# Patient Record
Sex: Male | Born: 1965 | Hispanic: No | Marital: Married | Smoking: Former smoker
Health system: Southern US, Community
[De-identification: ages and names within clinical notes are randomized; demographics above are authoritative.]

## PROBLEM LIST (undated history)

## (undated) DIAGNOSIS — G4730 Sleep apnea, unspecified: Secondary | ICD-10-CM

## (undated) DIAGNOSIS — I10 Essential (primary) hypertension: Secondary | ICD-10-CM

## (undated) DIAGNOSIS — E119 Type 2 diabetes mellitus without complications: Secondary | ICD-10-CM

## (undated) DIAGNOSIS — E7800 Pure hypercholesterolemia, unspecified: Secondary | ICD-10-CM

## (undated) DIAGNOSIS — Z87442 Personal history of urinary calculi: Secondary | ICD-10-CM

## (undated) HISTORY — PX: LITHOTRIPSY: SUR834

---

## 2014-10-02 ENCOUNTER — Other Ambulatory Visit: Payer: Self-pay | Admitting: Internal Medicine

## 2014-10-02 DIAGNOSIS — R3129 Other microscopic hematuria: Secondary | ICD-10-CM

## 2014-10-04 ENCOUNTER — Ambulatory Visit
Admission: RE | Admit: 2014-10-04 | Discharge: 2014-10-04 | Disposition: A | Discharge status: Home / Self Care | Payer: No Typology Code available for payment source | Source: Ambulatory Visit | Attending: Internal Medicine | Admitting: Internal Medicine

## 2014-10-04 DIAGNOSIS — R3129 Other microscopic hematuria: Secondary | ICD-10-CM

## 2015-10-03 IMAGING — CT CT ABD-PELV W/O CM
3 of 4 series · 8 of 46 positions shown, 15 images · non-contrast
Comparison: None.

CLINICAL DATA: Microscopic hematuria, denies pain. Prior
lithotripsy.

EXAM:
CT ABDOMEN AND PELVIS WITHOUT CONTRAST
TECHNIQUE: Multidetector CT imaging of the abdomen and pelvis was performed
following the standard protocol without IV contrast.

[Series 3: lung windows · axial · 0.70mm/px · z∈[-77,-17]mm · 4 of 20 slices shown, 9 images]
[im 4/20  soft-tissue]
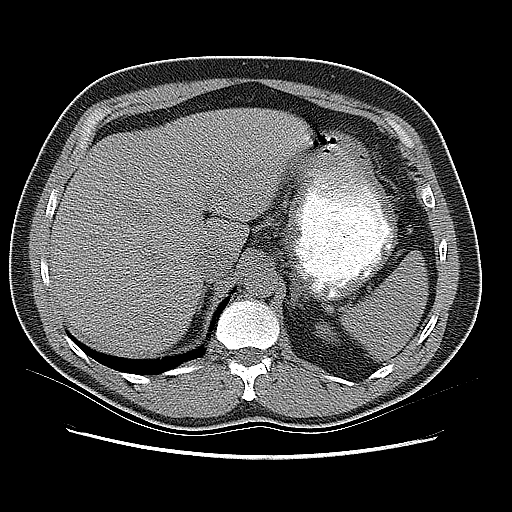
[im 4/20  lung]
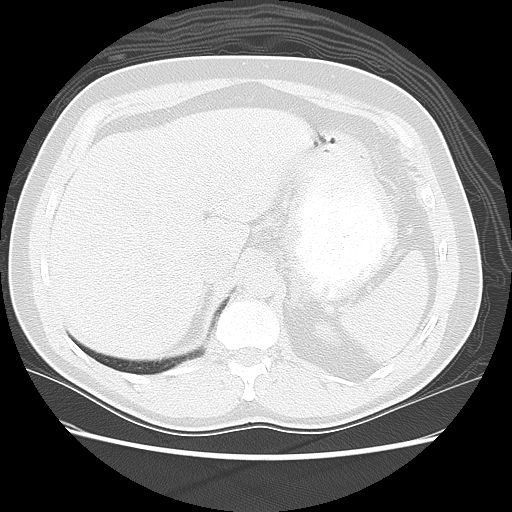
[im 4/20  bone]
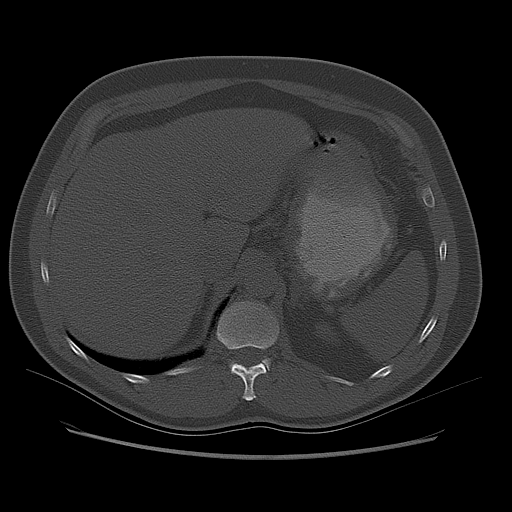
[im 8/20  soft-tissue]
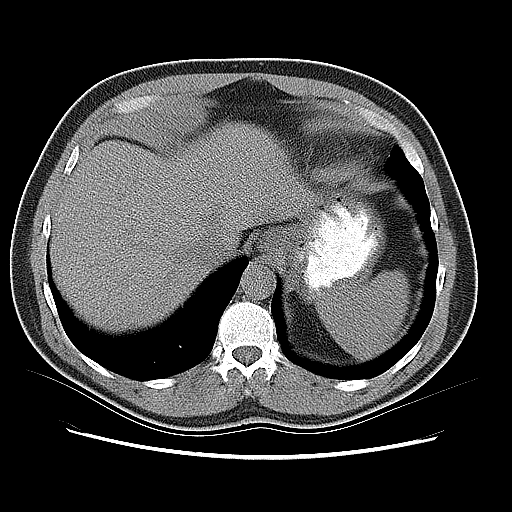
[im 8/20  lung]
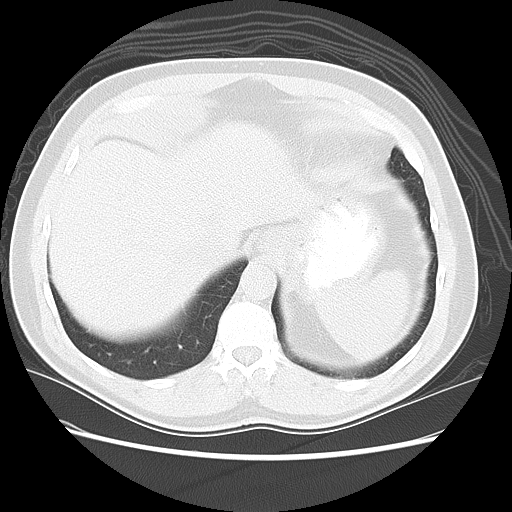
[im 12/20  soft-tissue]
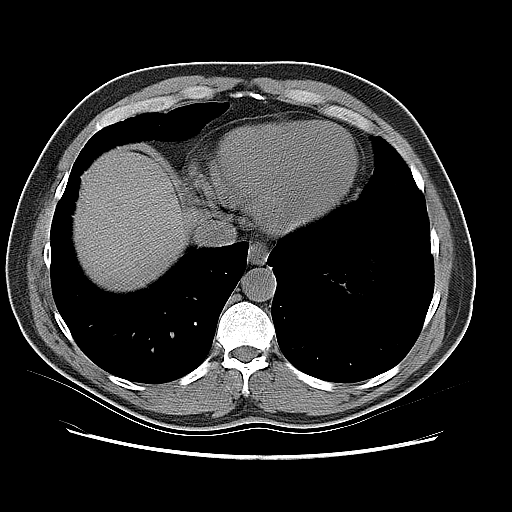
[im 12/20  lung]
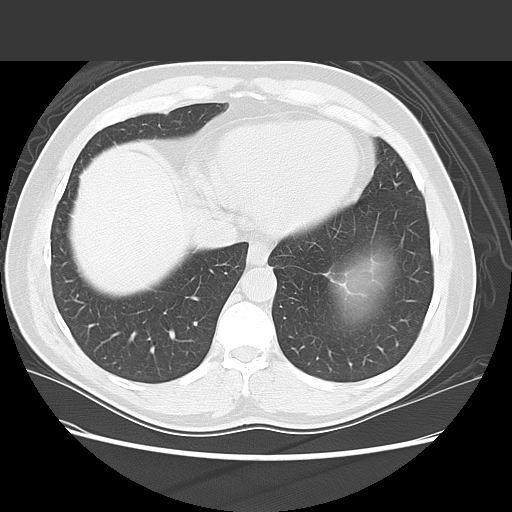
[im 16/20  soft-tissue]
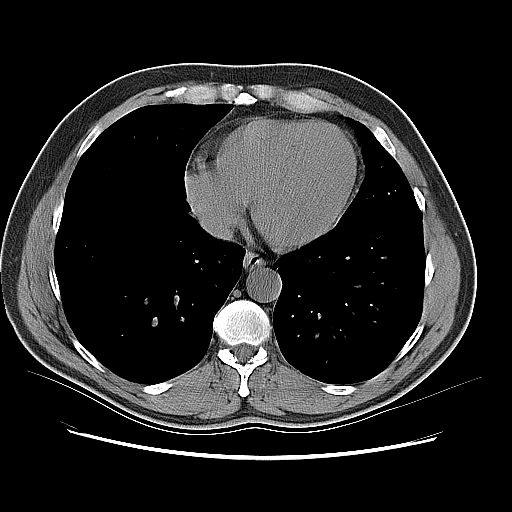
[im 16/20  lung]
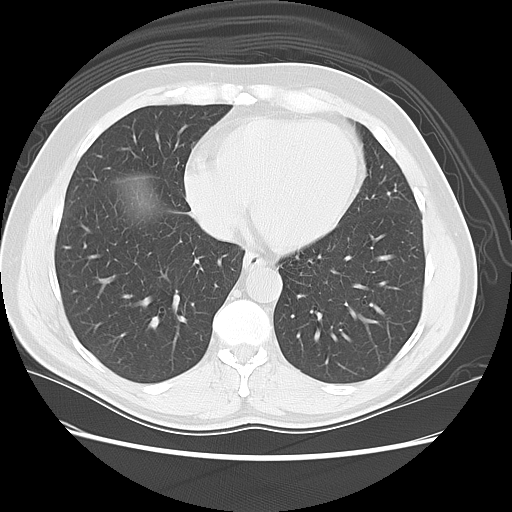

[Series 400: cor · coronal · 0.90mm/px · 3 of 141 slices shown, 4 images]
[im 47/141  soft-tissue]
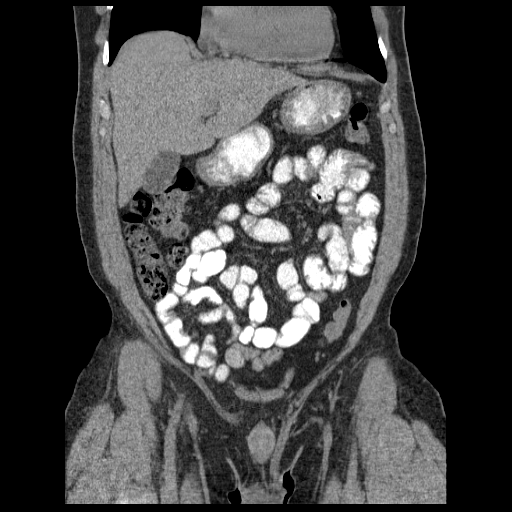
[im 63/141  soft-tissue]
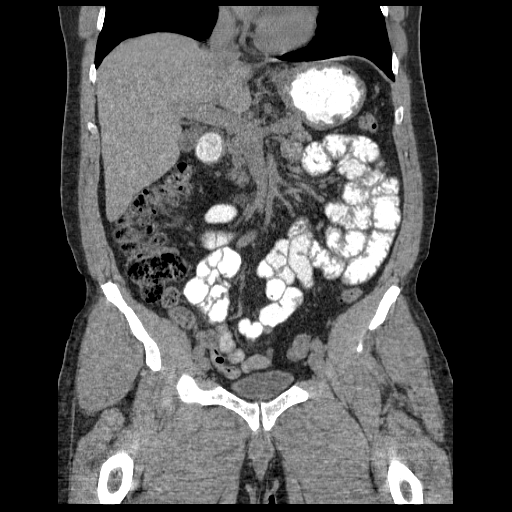
[im 63/141  bone]
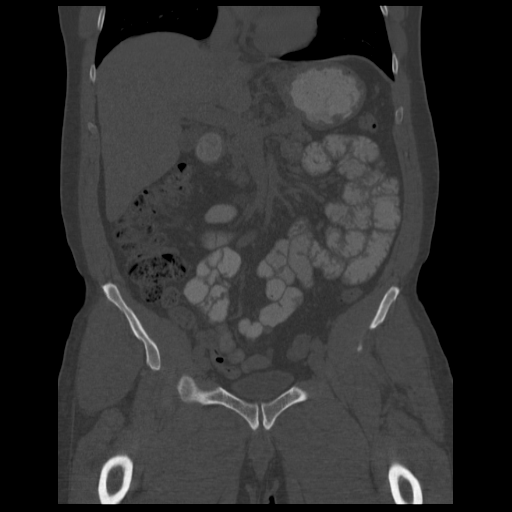
[im 78/141  soft-tissue]
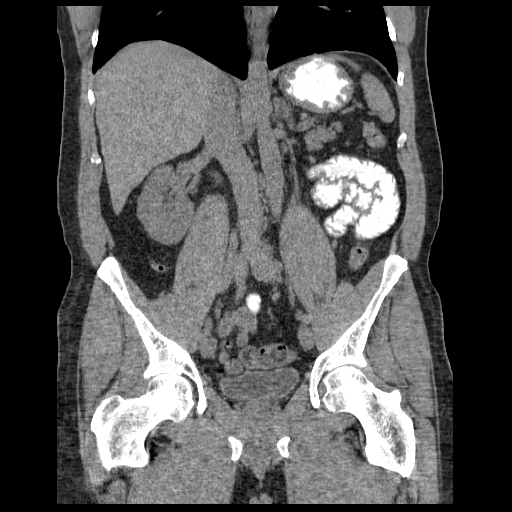

[Series 401: sag · sagittal · 0.90mm/px · 1 of 177 slices shown, 2 images]
[im 59/177  soft-tissue]
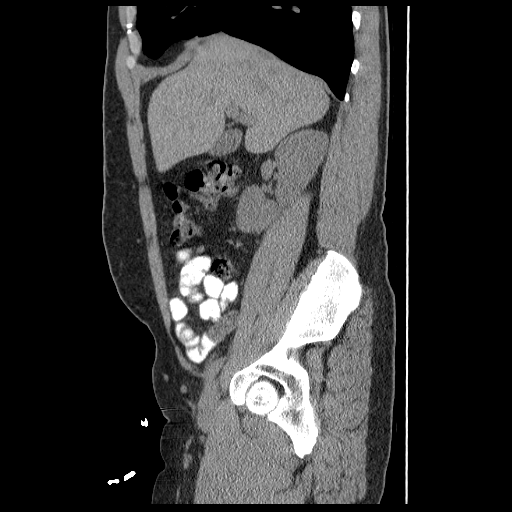
[im 59/177  bone]
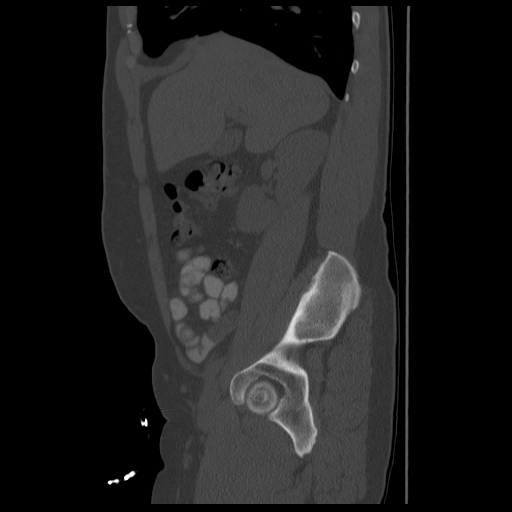

[8 of 46 positions shown; findings below may reference images not displayed]

FINDINGS: The lung bases are clear.

There is a nonobstructing right renal calculus measuring 3 mm. No
obstructive uropathy. No perinephric stranding is seen. The right
kidney is normal in size. The left kidney is severely atrophic. The
bladder is unremarkable.

The liver demonstrates no focal abnormality. The gallbladder is
unremarkable. The spleen demonstrates no focal abnormality. The
adrenal glands and pancreas are normal.

The stomach, duodenum, small intestine and large intestine are
unremarkable. There is a normal caliber appendix in the right lower
quadrant without periappendiceal inflammatory changes.There is a
small fat containing right inguinal hernia. There is no
pneumoperitoneum, pneumatosis, or portal venous gas. There is no
abdominal or pelvic free fluid. There is no lymphadenopathy.

The abdominal aorta is normal in caliber.

Mild degenerative disc disease at L5-S1 with a mild broad-based disc
bulge.
IMPRESSION: 1. Nonobstructing right nephrolithiasis.
2. Severely atrophic left kidney.

## 2016-02-26 ENCOUNTER — Ambulatory Visit: Payer: Self-pay | Attending: Internal Medicine

## 2016-03-07 ENCOUNTER — Ambulatory Visit: Payer: Self-pay | Attending: Internal Medicine

## 2016-07-06 ENCOUNTER — Ambulatory Visit (HOSPITAL_BASED_OUTPATIENT_CLINIC_OR_DEPARTMENT_OTHER): Payer: Medicaid Other | Attending: Otolaryngology | Admitting: Internal Medicine

## 2016-07-06 VITALS — Ht 66.0 in | Wt 195.0 lb

## 2016-07-06 DIAGNOSIS — G4710 Hypersomnia, unspecified: Secondary | ICD-10-CM | POA: Diagnosis not present

## 2016-07-06 DIAGNOSIS — G4730 Sleep apnea, unspecified: Secondary | ICD-10-CM | POA: Diagnosis not present

## 2016-07-06 DIAGNOSIS — R0683 Snoring: Secondary | ICD-10-CM | POA: Diagnosis present

## 2016-07-06 DIAGNOSIS — J3489 Other specified disorders of nose and nasal sinuses: Secondary | ICD-10-CM

## 2016-07-06 DIAGNOSIS — R5383 Other fatigue: Secondary | ICD-10-CM | POA: Diagnosis not present

## 2016-07-06 DIAGNOSIS — I10 Essential (primary) hypertension: Secondary | ICD-10-CM | POA: Diagnosis not present

## 2016-07-06 DIAGNOSIS — Z79899 Other long term (current) drug therapy: Secondary | ICD-10-CM | POA: Insufficient documentation

## 2016-07-06 DIAGNOSIS — G4733 Obstructive sleep apnea (adult) (pediatric): Secondary | ICD-10-CM | POA: Diagnosis not present

## 2016-07-07 ENCOUNTER — Other Ambulatory Visit (HOSPITAL_BASED_OUTPATIENT_CLINIC_OR_DEPARTMENT_OTHER): Payer: Self-pay

## 2016-07-07 DIAGNOSIS — G4730 Sleep apnea, unspecified: Secondary | ICD-10-CM

## 2016-07-07 DIAGNOSIS — R0683 Snoring: Secondary | ICD-10-CM

## 2016-07-07 DIAGNOSIS — J3489 Other specified disorders of nose and nasal sinuses: Secondary | ICD-10-CM

## 2016-07-12 DIAGNOSIS — G4710 Hypersomnia, unspecified: Secondary | ICD-10-CM

## 2016-07-12 DIAGNOSIS — R0683 Snoring: Secondary | ICD-10-CM

## 2016-07-12 DIAGNOSIS — G4730 Sleep apnea, unspecified: Secondary | ICD-10-CM

## 2016-07-12 DIAGNOSIS — J3489 Other specified disorders of nose and nasal sinuses: Secondary | ICD-10-CM | POA: Diagnosis not present

## 2016-07-12 NOTE — Procedures (Signed)
Patient Name: George Myers, George Myers Date: 07/06/2016 Gender: Male D.O.B: [birth year] Age (years): 50 Referring Provider: Izora Gala Height (inches): 56 Interpreting Physician: Baird Lyons MD, ABSM Weight (lbs): 195 RPSGT: Madelon Lips BMI: 31 MRN: [id number] Neck Size: 16.50 CLINICAL INFORMATION Sleep Study Type: Split Night CPAP Indication for sleep study: Fatigue, Hypertension, Snoring, Witnessed Apneas Epworth Sleepiness Score: 16  SLEEP STUDY TECHNIQUE As per the AASM Manual for the Scoring of Sleep and Associated Events v2.3 (April 2016) with a hypopnea requiring 4% desaturations. The channels recorded and monitored were frontal, central and occipital EEG, electrooculogram (EOG), submentalis EMG (chin), nasal and oral airflow, thoracic and abdominal wall motion, anterior tibialis EMG, snore microphone, electrocardiogram, and pulse oximetry. Continuous positive airway pressure (CPAP) was initiated when the patient met split night criteria and was titrated according to treat sleep-disordered breathing.  MEDICATIONS Medications taken by the patient : charted for review Medications administered by patient during sleep study : CLONIDINE HC.  RESPIRATORY PARAMETERS Diagnostic Total AHI (/hr): 26.8 RDI (/hr): 26.8 OA Index (/hr): 1.5 CA Index (/hr): 0.0 REM AHI (/hr): N/A NREM AHI (/hr): 26.8 Supine AHI (/hr): N/A Non-supine AHI (/hr): 26.78 Min O2 Sat (%): 88.00 Mean O2 (%): 93.86 Time below 88% (min): 0.1     Titration Optimal Pressure (cm): 13 AHI at Optimal Pressure (/hr): 12.2 Min O2 at Optimal Pressure (%): 89.0 Supine % at Optimal (%): 48 Sleep % at Optimal (%): 81      SLEEP ARCHITECTURE The recording time for the entire night was 363.2 minutes. During a baseline period of 153.8 minutes, the patient slept for 121.0 minutes in REM and nonREM, yielding a sleep efficiency of 78.7%. Sleep onset after lights out was 9.1 minutes with a REM latency of N/A minutes. The patient  spent 2.89% of the night in stage N1 sleep, 24.38% in stage N2 sleep, 72.73% in stage N3 and 0.00% in REM. During the titration period of 204.6 minutes, the patient slept for 156.5 minutes in REM and nonREM, yielding a sleep efficiency of 76.5%. Sleep onset after CPAP initiation was 13.4 minutes with a REM latency of N/A minutes. The patient spent 1.92% of the night in stage N1 sleep, 39.30% in stage N2 sleep, 58.79% in stage N3 and 0.00% in REM.  CARDIAC DATA The 2 lead EKG demonstrated sinus rhythm, pacemaker generated. The mean heart rate was 70.08 beats per minute. Other EKG findings include: None.  LEG MOVEMENT DATA The total Periodic Limb Movements of Sleep (PLMS) were 86. The PLMS index was 18.59 .There were no arousals due to limb movement.  IMPRESSIONS - Moderate obstructive sleep apnea occurred during the diagnostic portion of the study(AHI = 26.8/hour). An optimal PAP pressure is suggested 13. - No significant central sleep apnea occurred during the diagnostic portion of the study (CAI = 0.0/hour). - Mild oxygen desaturation was noted during the diagnostic portion of the study (Min O2 = 88.00%). - Snoring was audible during the diagnostic portion of the study. - No cardiac abnormalities were noted during this study. - Moderate periodic limb movements of sleep occurred during the study.  DIAGNOSIS - Obstructive Sleep Apnea (327.23 [G47.33 ICD-10])  RECOMMENDATIONS - Suboptimal CPAP titration due to computer malfunction. Suggest initial home trial at 13 cwp, or autotitration through [place]. - Avoid alcohol, sedatives and other CNS depressants that may worsen sleep apnea and disrupt normal sleep architecture. - Sleep hygiene should be reviewed to assess factors that may improve sleep quality. - Weight management and regular exercise  should be initiated or continued.  [Electronically signed] 07/12/2016 10:56 AM  Baird Lyons MD, ABSM Diplomate, American Board of Sleep  Medicine   NPI: [id number]  [place], American Board of Sleep Medicine  ELECTRONICALLY SIGNED ON:  07/12/2016, 10:46 AM [place] PH: [phone]   FX: [phone] [place]

## 2019-07-19 ENCOUNTER — Other Ambulatory Visit: Payer: Self-pay | Admitting: Urology

## 2019-07-20 ENCOUNTER — Encounter (HOSPITAL_COMMUNITY): Payer: Self-pay | Admitting: General Practice

## 2019-07-20 NOTE — Discharge Instructions (Signed)

## 2019-07-20 NOTE — H&P (Signed)
CC: I have ureteral stone.  HPI: George Myers is a 53 year-old male with a right ureteral stone.  The problem is on the right side. He first stated noticing pain on 07/15/2019. This is not his first kidney stone. He is currently having flank pain. He denies having back pain, groin pain, nausea, vomiting, fever, and chills. Pain is occuring on the right side. He has not caught a stone in his urine strainer since his symptoms began.   He has had eswl for treatment of his stones in the past.   Patient has an atrophic left kidney. He underwent CT scan of the abdomen and pelvis 07/15/2019 which revealed hydronephrosis down to an 11 mm stone in the right mid ureter. There was a 1-2 mm right lower pole stone reported. He has an atrophic left kidney. Left renal atrophy also noted on a CT in 2015.   He has had stone since a teenager.   He is voiding well. He has minimal pain. No fever.   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     ALLERGIES: None   MEDICATIONS: Metformin Hcl 1,000 mg tablet  Amlodipine Besylate 10 mg tablet  Atorvastatin Calcium 40 mg tablet  Losartan Potassium 50 mg tablet  Meloxicam 15 mg tablet     GU PSH: None   NON-GU PSH: None   GU PMH: Chronic kidney disease stage 3 (GFR 30-60) Renal calculus    NON-GU PMH: Diabetes Type 2 Hypercholesterolemia Hypertension Sleep Apnea    FAMILY HISTORY: kidney stone - Father    Notes: 3 sons; 2 daughters   SOCIAL HISTORY: Marital Status: Married Preferred Language: English Current Smoking Status: Patient does not smoke anymore. Has not smoked since 06/21/2017. Smoked for 35 years.   Tobacco Use Assessment Completed: Used Tobacco in last 30 days? Has never drank.  Drinks 2 caffeinated drinks per day. Patient's occupation Medical illustrator.    REVIEW OF SYSTEMS:    GU Review Male:   Patient reports frequent urination and get up at night to urinate. Patient denies hard to postpone urination, burning/ pain with urination, leakage of  urine, stream starts and stops, trouble starting your stream, have to strain to urinate , erection problems, and penile pain.  Gastrointestinal (Upper):   Patient denies nausea, vomiting, and indigestion/ heartburn.  Gastrointestinal (Lower):   Patient denies constipation and diarrhea.  Constitutional:   Patient reports fatigue. Patient denies fever, night sweats, and weight loss.  Skin:   Patient denies skin rash/ lesion and itching.  Eyes:   Patient reports blurred vision. Patient denies double vision.  Ears/ Nose/ Throat:   Patient denies sore throat and sinus problems.  Hematologic/Lymphatic:   Patient denies swollen glands and easy bruising.  Cardiovascular:   Patient denies leg swelling and chest pains.  Respiratory:   Patient denies cough and shortness of breath.  Endocrine:   Patient denies excessive thirst.  Musculoskeletal:   Patient reports joint pain. Patient denies back pain.  Neurological:   Patient denies headaches and dizziness.  Psychologic:   Patient denies depression and anxiety.   VITAL SIGNS:    Weight 177 lb / 80.29 kg  Height 66 in / 167.64 cm  BP 121/71 mmHg  Pulse 92 /min  Temperature 98.2 F / 36.7 C  BMI 28.6 kg/m   MULTI-SYSTEM PHYSICAL EXAMINATION:    Constitutional: Well-nourished. No physical deformities. Normally developed. Good grooming.  Neck: Neck symmetrical, not swollen. Normal tracheal position.  Respiratory: No labored breathing, no use of accessory  muscles.   Cardiovascular: Normal temperature, normal extremity pulses, no swelling, no varicosities.  Lymphatic: No enlargement of neck, axillae, groin.  Skin: No paleness, no jaundice, no cyanosis. No lesion, no ulcer, no rash.  Neurologic / Psychiatric: Oriented to time, oriented to place, oriented to person. No depression, no anxiety, no agitation.  Gastrointestinal: No mass, no tenderness, no rigidity, non obese abdomen.  Eyes: Normal conjunctivae. Normal eyelids.  Ears, Nose, Mouth, and Throat:  Left ear no scars, no lesions, no masses. Right ear no scars, no lesions, no masses. Nose no scars, no lesions, no masses. Normal hearing. Normal lips.  Musculoskeletal: Normal gait and station of head and neck.     PAST DATA REVIEWED:  Source Of History:  Patient  X-Ray Review: C.T. Abdomen/Pelvis: Reviewed Films. 2015    PROCEDURES:         KUB - [id number]  A single view of the abdomen is obtained.  Calculi:  11 mm opacity in expected location of mid-ureter and location of stone on CT report       The bones appeared normal. The bowel gas pattern appeared normal. The soft tissues were unremarkable.  Patient confirmed No Neulasta OnPro Device.           Urinalysis w/Scope Dipstick Dipstick Cont'd Micro  Color: Yellow Bilirubin: Neg mg/dL WBC/hpf: 0 - 5/hpf  Appearance: Clear Ketones: Neg mg/dL RBC/hpf: NS (Not Seen)  Specific Gravity: 1.015 Blood: Neg ery/uL Bacteria: NS (Not Seen)  pH: 5.5 Protein: Neg mg/dL Cystals: NS (Not Seen)  Glucose: Neg mg/dL Urobilinogen: 0.2 mg/dL Casts: NS (Not Seen)    Nitrites: Neg Trichomonas: Not Present    Leukocyte Esterase: Trace leu/uL Mucous: Not Present      Epithelial Cells: NS (Not Seen)      Yeast: NS (Not Seen)      Sperm: Not Present    ASSESSMENT/PLAN:      ICD-10 Details  1 GU:   Ureteral calculus - right ureter calculus - I discussed with the patient the nature risks and benefits of continued stone passage, off label use of alpha blockers, shockwave lithotripsy or ureteroscopy. All questions answered. We discussed the left kidney is small and if he ever had right flank pain and stopped making urine he would need to contact us immediately or see medical care in ED. Also discussed one of my colleagues would do the procedure.  2   Renal calculus - N20.0   3   Atrophy of kidney - N26.1

## 2019-07-21 ENCOUNTER — Encounter (HOSPITAL_COMMUNITY)
Admission: RE | Disposition: A | Discharge status: Home / Self Care | Payer: Self-pay | Source: Home / Self Care | Attending: Urology

## 2019-07-21 ENCOUNTER — Ambulatory Visit (HOSPITAL_COMMUNITY)
Admission: RE | Admit: 2019-07-21 | Discharge: 2019-07-21 | Disposition: A | Discharge status: Home / Self Care | Payer: Medicaid Other | Attending: Urology | Admitting: Urology

## 2019-07-21 ENCOUNTER — Ambulatory Visit (HOSPITAL_COMMUNITY): Payer: Medicaid Other

## 2019-07-21 ENCOUNTER — Other Ambulatory Visit: Payer: Self-pay

## 2019-07-21 ENCOUNTER — Encounter (HOSPITAL_COMMUNITY): Payer: Self-pay | Admitting: *Deleted

## 2019-07-21 DIAGNOSIS — E7800 Pure hypercholesterolemia, unspecified: Secondary | ICD-10-CM | POA: Diagnosis not present

## 2019-07-21 DIAGNOSIS — I129 Hypertensive chronic kidney disease with stage 1 through stage 4 chronic kidney disease, or unspecified chronic kidney disease: Secondary | ICD-10-CM | POA: Diagnosis not present

## 2019-07-21 DIAGNOSIS — Z7984 Long term (current) use of oral hypoglycemic drugs: Secondary | ICD-10-CM | POA: Insufficient documentation

## 2019-07-21 DIAGNOSIS — Z87891 Personal history of nicotine dependence: Secondary | ICD-10-CM | POA: Diagnosis not present

## 2019-07-21 DIAGNOSIS — G4730 Sleep apnea, unspecified: Secondary | ICD-10-CM | POA: Diagnosis not present

## 2019-07-21 DIAGNOSIS — N132 Hydronephrosis with renal and ureteral calculous obstruction: Secondary | ICD-10-CM | POA: Insufficient documentation

## 2019-07-21 DIAGNOSIS — Z791 Long term (current) use of non-steroidal anti-inflammatories (NSAID): Secondary | ICD-10-CM | POA: Diagnosis not present

## 2019-07-21 DIAGNOSIS — Z79899 Other long term (current) drug therapy: Secondary | ICD-10-CM | POA: Insufficient documentation

## 2019-07-21 DIAGNOSIS — N183 Chronic kidney disease, stage 3 (moderate): Secondary | ICD-10-CM | POA: Insufficient documentation

## 2019-07-21 DIAGNOSIS — N201 Calculus of ureter: Secondary | ICD-10-CM

## 2019-07-21 DIAGNOSIS — E1122 Type 2 diabetes mellitus with diabetic chronic kidney disease: Secondary | ICD-10-CM | POA: Diagnosis not present

## 2019-07-21 HISTORY — DX: Personal history of urinary calculi: Z87.442

## 2019-07-21 HISTORY — PX: EXTRACORPOREAL SHOCK WAVE LITHOTRIPSY: SHX1557

## 2019-07-21 HISTORY — DX: Type 2 diabetes mellitus without complications: E11.9

## 2019-07-21 HISTORY — DX: Sleep apnea, unspecified: G47.30

## 2019-07-21 HISTORY — DX: Pure hypercholesterolemia, unspecified: E78.00

## 2019-07-21 HISTORY — DX: Essential (primary) hypertension: I10

## 2019-07-21 LAB — GLUCOSE, CAPILLARY: Glucose-Capillary: 110 mg/dL — ABNORMAL HIGH (ref 70–99)

## 2019-07-21 SURGERY — LITHOTRIPSY, ESWL
Anesthesia: LOCAL | Laterality: Right

## 2019-07-21 MED ORDER — DIAZEPAM 5 MG PO TABS
10.0000 mg | ORAL_TABLET | ORAL | Status: AC
  Administered 2019-07-21: 10 mg via ORAL
  Filled 2019-07-21: qty 2

## 2019-07-21 MED ORDER — DIPHENHYDRAMINE HCL 25 MG PO CAPS
25.0000 mg | ORAL_CAPSULE | ORAL | Status: AC
  Administered 2019-07-21: 25 mg via ORAL
  Filled 2019-07-21: qty 1

## 2019-07-21 MED ORDER — HYDROCODONE-ACETAMINOPHEN 10-325 MG PO TABS: 1.0000 | ORAL_TABLET | ORAL | 0 refills | Status: AC | PRN

## 2019-07-21 MED ORDER — TAMSULOSIN HCL 0.4 MG PO CAPS: 0.4000 mg | ORAL_CAPSULE | ORAL | 0 refills | Status: AC

## 2019-07-21 MED ORDER — CIPROFLOXACIN HCL 500 MG PO TABS
500.0000 mg | ORAL_TABLET | ORAL | Status: AC
  Administered 2019-07-21: 09:00:00 500 mg via ORAL
  Filled 2019-07-21: qty 1

## 2019-07-21 MED ORDER — SODIUM CHLORIDE 0.9 % IV SOLN
INTRAVENOUS | Status: DC
  Administered 2019-07-21: 09:00:00 via INTRAVENOUS

## 2019-07-21 NOTE — Op Note (Signed)
See [facility] OP note scanned into chart. Also because of the size, density, location and other factors that cannot be anticipated I feel this will likely be a staged procedure. This fact supersedes any indication in the scanned [facility] operative note to the contrary.  

## 2019-07-22 ENCOUNTER — Encounter (HOSPITAL_COMMUNITY): Payer: Self-pay | Admitting: Urology

## 2019-07-26 ENCOUNTER — Encounter (HOSPITAL_COMMUNITY): Payer: Self-pay | Admitting: Urology
